# Patient Record
Sex: Male | Born: 1990 | Race: Black or African American | Hispanic: No | Marital: Married | Smoking: Never smoker
Health system: Southern US, Community
[De-identification: ages and names within clinical notes are randomized; demographics above are authoritative.]

## PROBLEM LIST (undated history)

## (undated) DIAGNOSIS — K589 Irritable bowel syndrome without diarrhea: Secondary | ICD-10-CM

## (undated) HISTORY — PX: FOOT SURGERY: SHX648

---

## 2021-11-07 ENCOUNTER — Other Ambulatory Visit: Payer: Self-pay

## 2021-11-07 ENCOUNTER — Emergency Department (HOSPITAL_BASED_OUTPATIENT_CLINIC_OR_DEPARTMENT_OTHER)
Admission: EM | Admit: 2021-11-07 | Discharge: 2021-11-07 | Disposition: A | Discharge status: Home / Self Care | Payer: 59 | Attending: Emergency Medicine | Admitting: Emergency Medicine

## 2021-11-07 ENCOUNTER — Emergency Department (HOSPITAL_BASED_OUTPATIENT_CLINIC_OR_DEPARTMENT_OTHER): Payer: 59

## 2021-11-07 ENCOUNTER — Encounter (HOSPITAL_BASED_OUTPATIENT_CLINIC_OR_DEPARTMENT_OTHER): Payer: Self-pay

## 2021-11-07 DIAGNOSIS — R200 Anesthesia of skin: Secondary | ICD-10-CM | POA: Insufficient documentation

## 2021-11-07 DIAGNOSIS — R29898 Other symptoms and signs involving the musculoskeletal system: Secondary | ICD-10-CM

## 2021-11-07 DIAGNOSIS — R0789 Other chest pain: Secondary | ICD-10-CM | POA: Insufficient documentation

## 2021-11-07 HISTORY — DX: Irritable bowel syndrome without diarrhea: K58.9

## 2021-11-07 LAB — BASIC METABOLIC PANEL
Anion gap: 7 (ref 5–15)
BUN: 18 mg/dL (ref 6–20)
CO2: 25 mmol/L (ref 22–32)
Calcium: 9.4 mg/dL (ref 8.9–10.3)
Chloride: 107 mmol/L (ref 98–111)
Creatinine, Ser: 1.33 mg/dL — ABNORMAL HIGH (ref 0.61–1.24)
GFR, Estimated: >60 mL/min (ref >60)
Glucose, Bld: 95 mg/dL (ref 70–99)
Potassium: 3.8 mmol/L (ref 3.5–5.1)
Sodium: 139 mmol/L (ref 135–145)

## 2021-11-07 LAB — CBC
HCT: 46 % (ref 39.0–52.0)
Hemoglobin: 15.3 g/dL (ref 13.0–17.0)
MCH: 28.3 pg (ref 26.0–34.0)
MCHC: 33.3 g/dL (ref 30.0–36.0)
MCV: 85 fL (ref 80.0–100.0)
Platelets: 194 10*3/uL (ref 150–400)
RBC: 5.41 MIL/uL (ref 4.22–5.81)
RDW: 14.1 % (ref 11.5–15.5)
WBC: 6.9 10*3/uL (ref 4.0–10.5)
nRBC: 0 % (ref 0.0–0.2)

## 2021-11-07 LAB — TROPONIN I (HIGH SENSITIVITY): Troponin I (High Sensitivity): 2 ng/L (ref <18)

## 2021-11-07 NOTE — ED Triage Notes (Signed)
Pt c/o CP started ~830am-NAD-steady gait

## 2021-11-07 NOTE — ED Provider Notes (Signed)
[facility] EMERGENCY DEPARTMENT Provider Note   CSN: [id number] Arrival date & time: 11/07/21  1200     History  Chief Complaint  Patient presents with   Chest Pain    Kristopher Murray is a 31 y.o. male.  31 year old male with history of IBS and panic attacks presents with concern for feeling unwell today, this has since resolved.  Patient states that he was at work today when he started to feel that he was having some gas pressure in his left upper abdomen and took an over-the-counter reliever.  Patient then developed a heaviness in his legs, tightness in his chest with discomfort in his left arm and left side of his jaw.  The symptoms have since resolved and he is feeling well at this time.  Patient also notes that last night he woke up and had numbness between his eyes which moved across his forehead, he moved his muscles in that area and the numbness resolved.  No significant cardiac history, no significant family history, is a non-smoker.      Home Medications Prior to Admission medications   Not on File      Allergies    Tamiflu [oseltamivir]    Review of Systems   Review of Systems Negative except as per HPI Physical Exam Updated Vital Signs BP 122/68    Pulse 71    Temp 98.3 F (36.8 C) (Oral)    Resp 15    Ht 5\' 4"  (1.626 m)    Wt 83.9 kg    SpO2 99%    BMI 31.76 kg/m  Physical Exam Vitals and nursing note reviewed.  Constitutional:      General: He is not in acute distress.    Appearance: He is well-developed. He is not diaphoretic.  HENT:     Head: Normocephalic and atraumatic.  Cardiovascular:     Rate and Rhythm: Normal rate and regular rhythm.     Heart sounds: Normal heart sounds.  Pulmonary:     Effort: Pulmonary effort is normal.     Breath sounds: Normal breath sounds.  Chest:     Chest wall: No tenderness.  Abdominal:     Palpations: Abdomen is soft.     Tenderness: There is no abdominal tenderness.  Musculoskeletal:     Cervical back:  Neck supple.     Right lower leg: No tenderness. No edema.     Left lower leg: No tenderness. No edema.  Skin:    General: Skin is warm and dry.     Findings: No erythema or rash.  Neurological:     General: No focal deficit present.     Mental Status: He is alert and oriented to person, place, and time.     Cranial Nerves: No cranial nerve deficit.     Motor: No weakness.  Psychiatric:        Behavior: Behavior normal.    ED Results / Procedures / Treatments   Labs (all labs ordered are listed, but only abnormal results are displayed) Labs Reviewed  BASIC METABOLIC PANEL - Abnormal; Notable for the following components:      Result Value   Creatinine, Ser 1.33 (*)    All other components within normal limits  CBC  TROPONIN I (HIGH SENSITIVITY)    EKG None  Radiology DG Chest Port 1 View  Result Date: 11/07/2021 CLINICAL DATA:  Chest pain EXAM: PORTABLE CHEST 1 VIEW COMPARISON:  None. FINDINGS: The heart size and mediastinal contours are  within normal limits. Both lungs are clear. The visualized skeletal structures are unremarkable. IMPRESSION: No active disease. Electronically Signed   By: Judie Petit.  Shick M.D.   On: 11/07/2021 12:49    Procedures Procedures    Medications Ordered in ED Medications - No data to display  ED Course/ Medical Decision Making/ A&P                           Medical Decision Making Amount and/or Complexity of Data Reviewed Labs: ordered. Radiology: ordered.   This patient presents to the ED for concern of chest tightness and leg heaviness, this involves an extensive number of treatment options, and is a complaint that carries with it a high risk of complications and morbidity.  The differential diagnosis includes but not limited to arrhythmia, ACS, pneumonia, PE   Co morbidities that complicate the patient evaluation  Melanoma   Additional history obtained:  External records from outside source obtained and reviewed including recent  PCP visit on 09/15/2021 for wellness visit, reports history of melanoma   Lab Tests:  I Ordered, and personally interpreted labs.  The pertinent results include: CBC, chemistry, troponin negative   Imaging Studies ordered:  I ordered imaging studies including chest x-ray I independently visualized and interpreted imaging which showed no acute disease I agree with the radiologist interpretation   Cardiac Monitoring:  The patient was maintained on a cardiac monitor.  I personally viewed and interpreted the cardiac monitored which showed an underlying rhythm of: Sinus rhythm   Problem List / ED Course:  31 year old male with concern for chest tightness and lightheadedness as above.  Exam is unremarkable and symptoms resolved at time of arrival.  His work-up today is reassuring including troponin negative, CBC and chemistry unremarkable.  His EKG does not show any ischemic changes.  Chest x-ray is unremarkable.  Recommend follow-up with primary care provider for further evaluation.   Reevaluation:  After the interventions noted above, I reevaluated the patient and found that they have :resolved  Dispostion:  After consideration of the diagnostic results and the patients response to treatment, I feel that the patent would benefit from recheck with primary care provider.          Final Clinical Impression(s) / ED Diagnoses Final diagnoses:  Atypical chest pain  Leg heaviness    Rx / DC Orders ED Discharge Orders     None         Jeannie Fend, PA-C 11/07/21 1345    Derwood Kaplan, MD 11/08/21 [phone]

## 2021-11-07 NOTE — Discharge Instructions (Signed)
Your work-up today is reassuring.  Recommend follow-up with your doctor for recheck.

## 2023-02-20 IMAGING — DX DG CHEST 1V PORT
1 series · 1 of 1 positions shown · non-contrast
Comparison: None.

CLINICAL DATA: Chest pain

EXAM:
PORTABLE CHEST 1 VIEW

[chest ap]
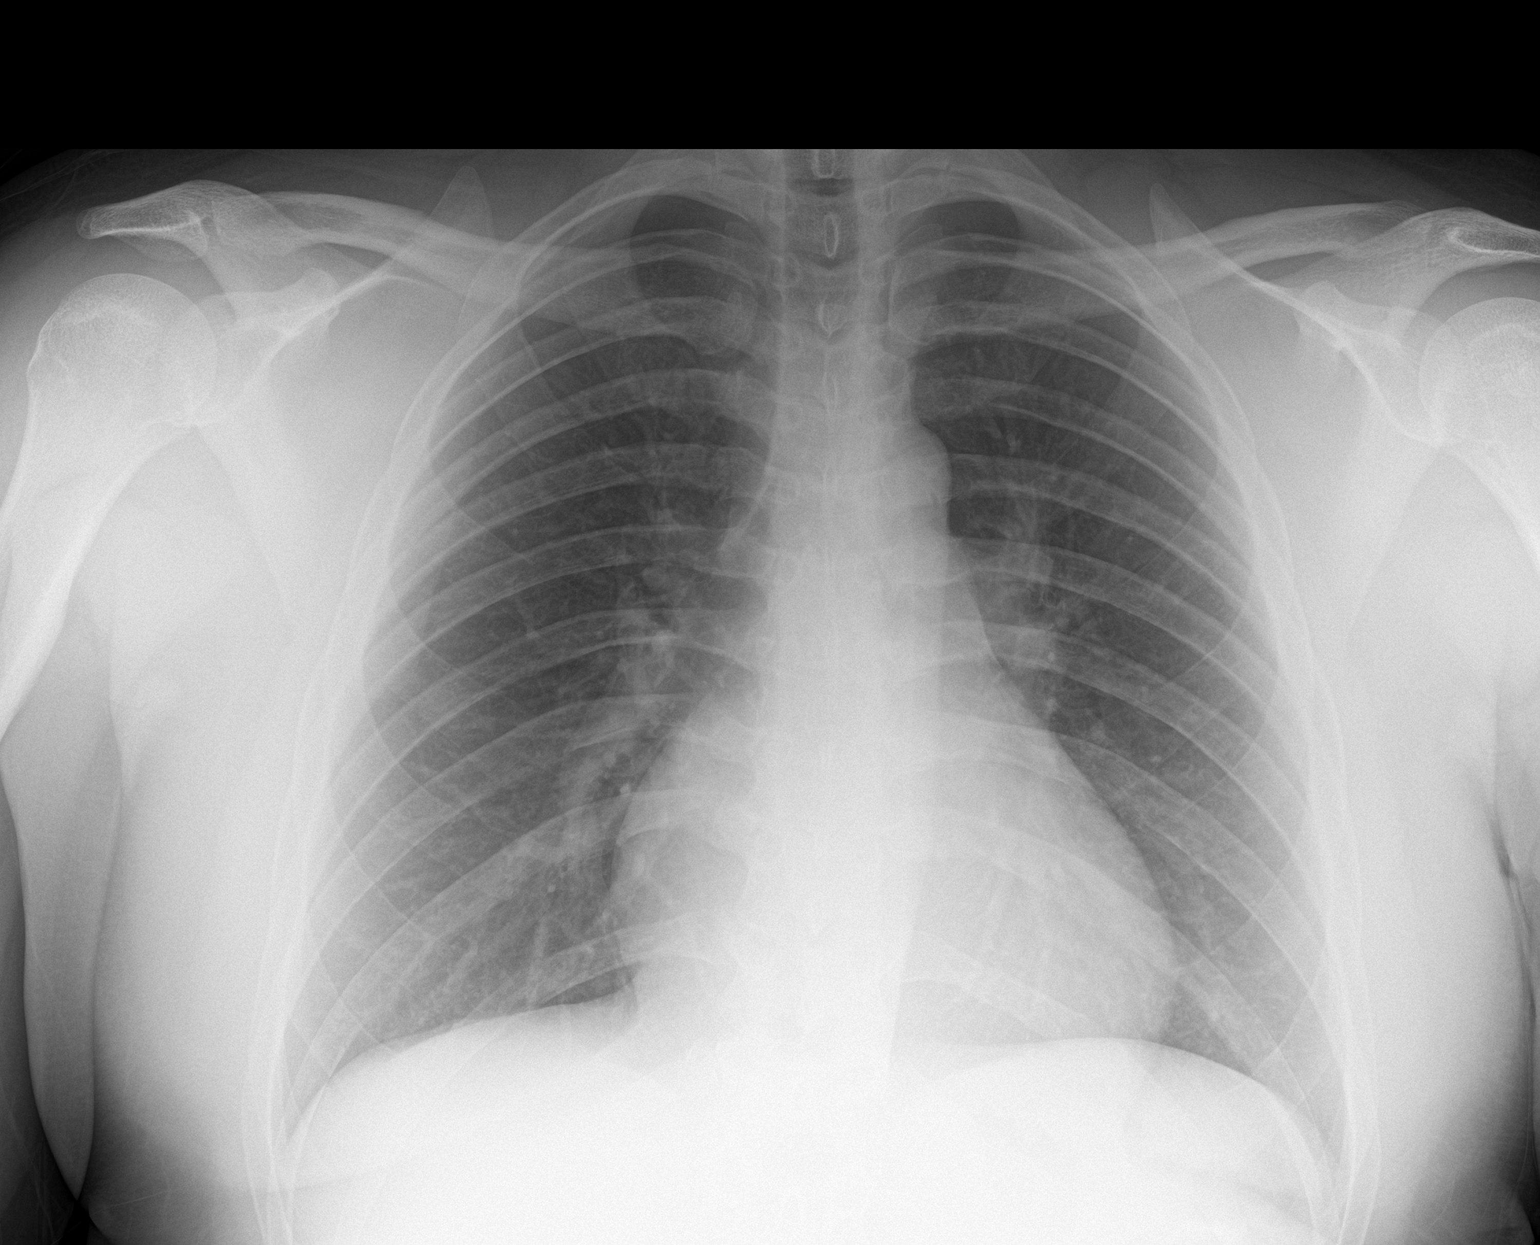

[1 of 1 positions shown; findings below may reference images not displayed]

FINDINGS: The heart size and mediastinal contours are within normal limits.
Both lungs are clear. The visualized skeletal structures are
unremarkable.
IMPRESSION: No active disease.
